# Patient Record
Sex: Female | Born: 1995 | Race: Black or African American | Hispanic: No | Marital: Single | Smoking: Former smoker
Health system: Southern US, Community
[De-identification: ages and names within clinical notes are randomized; demographics above are authoritative.]

## PROBLEM LIST (undated history)

## (undated) DIAGNOSIS — J45909 Unspecified asthma, uncomplicated: Secondary | ICD-10-CM

## (undated) DIAGNOSIS — D649 Anemia, unspecified: Secondary | ICD-10-CM

## (undated) HISTORY — PX: DILATION AND EVACUATION: SHX1459

---
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
## 2016-07-20 ENCOUNTER — Encounter: Payer: Self-pay | Admitting: Emergency Medicine

## 2016-07-20 ENCOUNTER — Emergency Department
Admission: EM | Admit: 2016-07-20 | Discharge: 2016-07-20 | Disposition: A | Discharge status: Home / Self Care | Payer: Self-pay | Attending: Emergency Medicine | Admitting: Emergency Medicine

## 2016-07-20 DIAGNOSIS — R51 Headache: Secondary | ICD-10-CM | POA: Insufficient documentation

## 2016-07-20 DIAGNOSIS — R519 Headache, unspecified: Secondary | ICD-10-CM

## 2016-07-20 LAB — POCT PREGNANCY, URINE: Preg Test, Ur: NEGATIVE

## 2016-07-20 MED ORDER — ONDANSETRON 4 MG PO TBDP
4.0000 mg | ORAL_TABLET | Freq: Once | ORAL | Status: AC | PRN
  Administered 2016-07-20: 4 mg via ORAL

## 2016-07-20 MED ORDER — ONDANSETRON 4 MG PO TBDP
ORAL_TABLET | ORAL | Status: DC
Start: 2016-07-20 — End: 2016-07-20
  Filled 2016-07-20: qty 1

## 2016-07-20 MED ORDER — KETOROLAC TROMETHAMINE 10 MG PO TABS
10.0000 mg | ORAL_TABLET | Freq: Once | ORAL | Status: AC
  Administered 2016-07-20: 10 mg via ORAL
  Filled 2016-07-20: qty 1

## 2016-07-20 NOTE — ED Triage Notes (Signed)
Pt ambulatory to triage in NAD, reports HA starting around 6 hours ago, described as throbbing, w/ n/v, denies sensitivity to light.  Denies hx of migraines.

## 2016-07-20 NOTE — ED Notes (Signed)
Brown, MD and Butch, RN at bedside at this time. 

## 2016-07-20 NOTE — ED Provider Notes (Signed)
[facility] Emergency Department Provider Note    First MD Initiated Contact with Patient 07/20/16 0413     (approximate)  I have reviewed the triage vital signs and the nursing notes.   HISTORY  Chief Complaint Headache    HPI Amy [name] is a 21 y.o. female presents to the emergency department with a headache that is generalized and began approximately 6 hours ago. Patient describes the pain as throbbing current pain score 8 out of 10. Patient states that she did not take any medication for headache before she presented to the emergency department. Patient denies any weakness numbness gait instability or visual changes   History reviewed. No pertinent past medical history.  Patient Active Problem List   Diagnosis Date Noted  . Depression, major, recurrent, moderate (HCC) 10/09/2015  . PTSD (post-traumatic stress disorder) 10/09/2015    History reviewed. No pertinent surgical history.  Prior to Admission medications   Not on File    Allergies No known drug allergies History reviewed. No pertinent family history.  Social History Social History  Substance Use Topics  . Smoking status: Never Smoker  . Smokeless tobacco: Never Used  . Alcohol use No    Review of Systems Constitutional: No fever/chills Eyes: No visual changes. ENT: No sore throat. Cardiovascular: Denies chest pain. Respiratory: Denies shortness of breath. Gastrointestinal: No abdominal pain.  No nausea, no vomiting.  No diarrhea.  No constipation. Genitourinary: Negative for dysuria. Musculoskeletal: Negative for back pain. Skin: Negative for rash. Neurological: Positive for headaches, negative for focal weakness or numbness.  10-point ROS otherwise negative.  ____________________________________________   PHYSICAL EXAM:  VITAL SIGNS: ED Triage Vitals  Enc Vitals Group     BP 07/20/16 0202 (!) 127/91     Pulse Rate 07/20/16 0202 91     Resp 07/20/16 0202  17     Temp 07/20/16 0202 98.7 F (37.1 C)     Temp Source 07/20/16 0202 Oral     SpO2 07/20/16 0202 97 %     Weight 07/20/16 0202 113 lb (51.3 kg)     Height 07/20/16 0202 5\' 1"  (1.549 m)     Head Circumference --      Peak Flow --      Pain Score 07/20/16 0206 10     Pain Loc --      Pain Edu? --      Excl. in GC? --     Constitutional: Alert and oriented. Well appearing and in no acute distress. Eyes: Conjunctivae are normal. PERRL. EOMI. Head: Atraumatic. Nose: No congestion/rhinnorhea. Mouth/Throat: Mucous membranes are moist.  Oropharynx non-erythematous. Neck: No stridor.  No meningeal signs.  Cardiovascular: Normal rate, regular rhythm. Good peripheral circulation. Grossly normal heart sounds. Respiratory: Normal respiratory effort.  No retractions. Lungs CTAB. Gastrointestinal: Soft and nontender. No distention.  Musculoskeletal: No lower extremity tenderness nor edema. No gross deformities of extremities. Neurologic:  Normal speech and language. No gross focal neurologic deficits are appreciated.  Skin:  Skin is warm, dry and intact. No rash noted. Psychiatric: Mood and affect are normal. Speech and behavior are normal.  ____________________________________________  Procedures   ______   INITIAL IMPRESSION / ASSESSMENT AND PLAN / ED COURSE  Pertinent labs & imaging results that were available during my care of the patient were reviewed by me and considered in my medical decision making (see chart for details).  21 year old female presenting with throbbing headache, no focal neurological deficits noted on exam. Patient  had a CT scan performed June of last year which was negative for masses or any intracranial pathology. Given negative exam we'll not perform any imaging at this time. Patient given Toradol 10 mg tablet with improvement of pain      ____________________________________________  FINAL CLINICAL IMPRESSION(S) / ED DIAGNOSES  Final diagnoses:    Acute nonintractable headache, unspecified headache type     MEDICATIONS GIVEN DURING THIS VISIT:  Medications  ketorolac (TORADOL) tablet 10 mg (not administered)  ondansetron (ZOFRAN-ODT) disintegrating tablet 4 mg (4 mg Oral Given 07/20/16 0209)     NEW OUTPATIENT MEDICATIONS STARTED DURING THIS VISIT:  New Prescriptions   No medications on file    Modified Medications   No medications on file    Discontinued Medications   No medications on file     Note:  This document was prepared using Dragon voice recognition software and may include unintentional dictation errors.    Darci Current, MD 07/20/16 [phone]

## 2016-08-17 LAB — HM HIV SCREENING LAB: HM HIV Screening: NEGATIVE

## 2016-09-27 ENCOUNTER — Encounter: Payer: Self-pay | Admitting: Emergency Medicine

## 2016-09-27 ENCOUNTER — Emergency Department
Admission: EM | Admit: 2016-09-27 | Discharge: 2016-09-27 | Disposition: A | Discharge status: Home / Self Care | Payer: Self-pay | Attending: Emergency Medicine | Admitting: Emergency Medicine

## 2016-09-27 DIAGNOSIS — Z87891 Personal history of nicotine dependence: Secondary | ICD-10-CM | POA: Insufficient documentation

## 2016-09-27 DIAGNOSIS — J45909 Unspecified asthma, uncomplicated: Secondary | ICD-10-CM | POA: Insufficient documentation

## 2016-09-27 DIAGNOSIS — K529 Noninfective gastroenteritis and colitis, unspecified: Secondary | ICD-10-CM | POA: Insufficient documentation

## 2016-09-27 HISTORY — DX: Anemia, unspecified: D64.9

## 2016-09-27 HISTORY — DX: Unspecified asthma, uncomplicated: J45.909

## 2016-09-27 LAB — CBC
HCT: 40.4 % (ref 35.0–47.0)
HEMOGLOBIN: 13.4 g/dL (ref 12.0–16.0)
MCH: 28.4 pg (ref 26.0–34.0)
MCHC: 33.2 g/dL (ref 32.0–36.0)
MCV: 85.4 fL (ref 80.0–100.0)
Platelets: 163 10*3/uL (ref 150–440)
RBC: 4.73 MIL/uL (ref 3.80–5.20)
RDW: 13.9 % (ref 11.5–14.5)
WBC: 4.7 10*3/uL (ref 3.6–11.0)

## 2016-09-27 LAB — COMPREHENSIVE METABOLIC PANEL
ALBUMIN: 3.9 g/dL (ref 3.5–5.0)
ALT: 11 U/L — ABNORMAL LOW (ref 14–54)
ANION GAP: 5 (ref 5–15)
AST: 28 U/L (ref 15–41)
Alkaline Phosphatase: 64 U/L (ref 38–126)
BUN: 14 mg/dL (ref 6–20)
CHLORIDE: 102 mmol/L (ref 101–111)
CO2: 29 mmol/L (ref 22–32)
Calcium: 9.1 mg/dL (ref 8.9–10.3)
Creatinine, Ser: 0.68 mg/dL (ref 0.44–1.00)
GFR calc non Af Amer: >60 mL/min (ref >60)
GLUCOSE: 107 mg/dL — AB (ref 65–99)
Potassium: 4.4 mmol/L (ref 3.5–5.1)
SODIUM: 136 mmol/L (ref 135–145)
Total Bilirubin: 0.4 mg/dL (ref 0.3–1.2)
Total Protein: 7.9 g/dL (ref 6.5–8.1)

## 2016-09-27 LAB — URINALYSIS, COMPLETE (UACMP) WITH MICROSCOPIC
BACTERIA UA: NONE SEEN
Bilirubin Urine: NEGATIVE
GLUCOSE, UA: NEGATIVE mg/dL
Hgb urine dipstick: NEGATIVE
KETONES UR: NEGATIVE mg/dL
Leukocytes, UA: NEGATIVE
Nitrite: NEGATIVE
PROTEIN: NEGATIVE mg/dL
Specific Gravity, Urine: 1.012 (ref 1.005–1.030)
pH: 7 (ref 5.0–8.0)

## 2016-09-27 LAB — LIPASE, BLOOD: LIPASE: 22 U/L (ref 11–51)

## 2016-09-27 LAB — POCT PREGNANCY, URINE: PREG TEST UR: NEGATIVE

## 2016-09-27 MED ORDER — ONDANSETRON HCL 4 MG PO TABS: 4.0000 mg | ORAL_TABLET | Freq: Three times a day (TID) | ORAL | 0 refills | Status: AC | PRN

## 2016-09-27 MED ORDER — METOCLOPRAMIDE HCL 5 MG/ML IJ SOLN
10.0000 mg | Freq: Once | INTRAMUSCULAR | Status: AC
  Administered 2016-09-27: 10 mg via INTRAVENOUS
  Filled 2016-09-27: qty 2

## 2016-09-27 MED ORDER — SODIUM CHLORIDE 0.9 % IV BOLUS (SEPSIS)
1000.0000 mL | Freq: Once | INTRAVENOUS | Status: AC
  Administered 2016-09-27: 1000 mL via INTRAVENOUS

## 2016-09-27 NOTE — ED Triage Notes (Signed)
Pt c/o n/v/d intermittently since Tuesday. x1 emesis last 24 hours. C/o mid abd pain, nontender to palpation. Denies urinary symptoms.

## 2016-09-27 NOTE — ED Notes (Signed)
Pt reports that she has had nausea for 2 days and started vomiting today (vomited x1 in 24 hours) - c/o abd pain - reports diarrhea Tuesday (3 loose stools in 24 hours)

## 2016-09-27 NOTE — ED Provider Notes (Signed)
[facility] Emergency Department Provider Note  ____________________________________________   I have reviewed the triage vital signs and the nursing notes.   HISTORY  Chief Complaint Emesis   History limited by: Not Limited   HPI Amy [name] is a 21 y.o. female who presents to the emergency department today because of concerns for nausea and vomiting. This is been going on for the past 2 mornings. The patient states she also has had some diarrhea. No significant associated abdominal pain. She has not noticed any bloody vomiting. She has not had any fevers. Denies any known sick contacts or unusual ingestions.   Past Medical History:  Diagnosis Date  . Anemia   . Asthma     Patient Active Problem List   Diagnosis Date Noted  . Depression, major, recurrent, moderate (HCC) 10/09/2015  . PTSD (post-traumatic stress disorder) 10/09/2015    History reviewed. No pertinent surgical history.  Prior to Admission medications   Not on File    Allergies Patient has no known allergies.  History reviewed. No pertinent family history.  Social History Social History  Substance Use Topics  . Smoking status: Former Smoker    Types: Cigarettes  . Smokeless tobacco: Never Used  . Alcohol use No     Comment: once/week    Review of Systems Constitutional: No fever/chills Eyes: No visual changes. ENT: No sore throat. Cardiovascular: Denies chest pain. Respiratory: Denies shortness of breath. Gastrointestinal: No abdominal pain.  Positive for nausea, vomiting, diarrhea.  Genitourinary: Negative for dysuria. Musculoskeletal: Negative for back pain. Skin: Negative for rash. Neurological: Negative for headaches, focal weakness or numbness.  ____________________________________________   PHYSICAL EXAM:  VITAL SIGNS: ED Triage Vitals  Enc Vitals Group     BP 09/27/16 1529 124/74     Pulse Rate 09/27/16 1529 90     Resp 09/27/16 1529 18      Temp 09/27/16 1529 99.1 F (37.3 C)     Temp Source 09/27/16 1529 Oral     SpO2 09/27/16 1529 98 %     Weight 09/27/16 1531 110 lb (49.9 kg)     Height 09/27/16 1531 5' 1.5" (1.562 m)     Head Circumference --      Peak Flow --      Pain Score 09/27/16 1537 7   Constitutional: Alert and oriented. Well appearing and in no distress. Eyes: Conjunctivae are normal.  ENT   Head: Normocephalic and atraumatic.   Nose: No congestion/rhinnorhea.   Mouth/Throat: Mucous membranes are moist.   Neck: No stridor. Hematological/Lymphatic/Immunilogical: No cervical lymphadenopathy. Cardiovascular: Normal rate, regular rhythm.  No murmurs, rubs, or gallops.  Respiratory: Normal respiratory effort without tachypnea nor retractions. Breath sounds are clear and equal bilaterally. No wheezes/rales/rhonchi. Gastrointestinal: Soft and non tender. No rebound. No guarding.  Genitourinary: Deferred Musculoskeletal: Normal range of motion in all extremities. No lower extremity edema. Neurologic:  Normal speech and language. No gross focal neurologic deficits are appreciated.  Skin:  Skin is warm, dry and intact. No rash noted. Psychiatric: Mood and affect are normal. Speech and behavior are normal. Patient exhibits appropriate insight and judgment.  ____________________________________________    LABS (pertinent positives/negatives)  Labs Reviewed  COMPREHENSIVE METABOLIC PANEL - Abnormal; Notable for the following:       Result Value   Glucose, Bld 107 (*)    ALT 11 (*)    All other components within normal limits  URINALYSIS, COMPLETE (UACMP) WITH MICROSCOPIC - Abnormal; Notable for the following:  Color, Urine YELLOW (*)    APPearance CLEAR (*)    Squamous Epithelial / LPF 0-5 (*)    All other components within normal limits  LIPASE, BLOOD  CBC  POC URINE PREG, ED  POCT PREGNANCY, URINE      ____________________________________________   EKG  None  ____________________________________________    RADIOLOGY  None  ____________________________________________   PROCEDURES  Procedures  ____________________________________________   INITIAL IMPRESSION / ASSESSMENT AND PLAN / ED COURSE  Pertinent labs & imaging results that were available during my care of the patient were reviewed by me and considered in my medical decision making (see chart for details).  Patient presented with nausea vomiting diarrhea. Blood work and urine without any overly concerning findings. Patient did feel better after IV fluids and nausea medication. At this point I think gastroenteritis likely. I doubt significant intra-abdominal infection and a do not think emergent imaging is necessary at this time. Will discharge with antiemetics.  ____________________________________________   FINAL CLINICAL IMPRESSION(S) / ED DIAGNOSES  Final diagnoses:  Gastroenteritis     Note: This dictation was prepared with Dragon dictation. Any transcriptional errors that result from this process are unintentional     Phineas Semen, MD 09/27/16 [phone]

## 2016-09-27 NOTE — Discharge Instructions (Signed)
Please seek medical attention for any high fevers, chest pain, shortness of breath, change in behavior, persistent vomiting, bloody stool or any other new or concerning symptoms.  

## 2016-10-03 ENCOUNTER — Emergency Department
Admission: EM | Admit: 2016-10-03 | Discharge: 2016-10-03 | Disposition: A | Discharge status: Home / Self Care | Payer: Self-pay | Attending: Emergency Medicine | Admitting: Emergency Medicine

## 2016-10-03 ENCOUNTER — Emergency Department: Payer: Self-pay

## 2016-10-03 DIAGNOSIS — J45909 Unspecified asthma, uncomplicated: Secondary | ICD-10-CM | POA: Insufficient documentation

## 2016-10-03 DIAGNOSIS — Y939 Activity, unspecified: Secondary | ICD-10-CM | POA: Insufficient documentation

## 2016-10-03 DIAGNOSIS — W010XXA Fall on same level from slipping, tripping and stumbling without subsequent striking against object, initial encounter: Secondary | ICD-10-CM | POA: Insufficient documentation

## 2016-10-03 DIAGNOSIS — Z87891 Personal history of nicotine dependence: Secondary | ICD-10-CM | POA: Insufficient documentation

## 2016-10-03 DIAGNOSIS — Y929 Unspecified place or not applicable: Secondary | ICD-10-CM | POA: Insufficient documentation

## 2016-10-03 DIAGNOSIS — Y998 Other external cause status: Secondary | ICD-10-CM | POA: Insufficient documentation

## 2016-10-03 DIAGNOSIS — S322XXA Fracture of coccyx, initial encounter for closed fracture: Secondary | ICD-10-CM | POA: Insufficient documentation

## 2016-10-03 MED ORDER — IBUPROFEN 600 MG PO TABS
600.0000 mg | ORAL_TABLET | Freq: Once | ORAL | Status: AC
  Administered 2016-10-03: 600 mg via ORAL
  Filled 2016-10-03: qty 1

## 2016-10-03 MED ORDER — OXYCODONE-ACETAMINOPHEN 5-325 MG PO TABS
1.0000 | ORAL_TABLET | Freq: Once | ORAL | Status: AC
  Administered 2016-10-03: 1 via ORAL

## 2016-10-03 MED ORDER — OXYCODONE-ACETAMINOPHEN 5-325 MG PO TABS
1.0000 | ORAL_TABLET | Freq: Once | ORAL | Status: AC
  Administered 2016-10-03: 1 via ORAL
  Filled 2016-10-03: qty 1

## 2016-10-03 MED ORDER — OXYCODONE-ACETAMINOPHEN 5-325 MG PO TABS
ORAL_TABLET | ORAL | Status: AC
  Administered 2016-10-03: 1 via ORAL
  Filled 2016-10-03: qty 1

## 2016-10-03 MED ORDER — IBUPROFEN 600 MG PO TABS: 600.0000 mg | ORAL_TABLET | Freq: Three times a day (TID) | ORAL | 0 refills | Status: AC | PRN

## 2016-10-03 MED ORDER — OXYCODONE-ACETAMINOPHEN 5-325 MG PO TABS: 1.0000 | ORAL_TABLET | ORAL | 0 refills | Status: AC | PRN
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
## 2016-10-03 NOTE — ED Notes (Signed)
Patient discharged to home per MD order. Patient in stable condition, and deemed medically cleared by ED provider for discharge. Discharge instructions reviewed with patient/family using "Teach Back"; verbalized understanding of medication education and administration, and information about follow-up care. Denies further concerns. ° °
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
## 2017-01-29 DIAGNOSIS — F419 Anxiety disorder, unspecified: Secondary | ICD-10-CM | POA: Insufficient documentation

## 2017-09-26 ENCOUNTER — Encounter: Payer: Self-pay | Admitting: *Deleted

## 2017-09-26 ENCOUNTER — Emergency Department
Admission: EM | Admit: 2017-09-26 | Discharge: 2017-09-26 | Disposition: A | Discharge status: Home / Self Care | Payer: Medicaid Other | Attending: Student in an Organized Health Care Education/Training Program | Admitting: Student in an Organized Health Care Education/Training Program

## 2017-09-26 ENCOUNTER — Other Ambulatory Visit: Payer: Self-pay

## 2017-09-26 DIAGNOSIS — M7918 Myalgia, other site: Secondary | ICD-10-CM

## 2017-09-26 DIAGNOSIS — M7912 Myalgia of auxiliary muscles, head and neck: Secondary | ICD-10-CM | POA: Insufficient documentation

## 2017-09-26 MED ORDER — CYCLOBENZAPRINE HCL 5 MG PO TABS
5.0000 mg | ORAL_TABLET | Freq: Three times a day (TID) | ORAL | 0 refills | Status: AC | PRN
Start: 2017-09-26 — End: ?
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
## 2018-03-07 ENCOUNTER — Emergency Department
Admission: EM | Admit: 2018-03-07 | Discharge: 2018-03-07 | Disposition: A | Discharge status: Home / Self Care | Payer: BLUE CROSS/BLUE SHIELD | Attending: Emergency Medicine | Admitting: Emergency Medicine

## 2018-03-07 ENCOUNTER — Encounter: Payer: Self-pay | Admitting: *Deleted

## 2018-03-07 ENCOUNTER — Emergency Department: Payer: BLUE CROSS/BLUE SHIELD

## 2018-03-07 ENCOUNTER — Other Ambulatory Visit: Payer: Self-pay

## 2018-03-07 DIAGNOSIS — Z5321 Procedure and treatment not carried out due to patient leaving prior to being seen by health care provider: Secondary | ICD-10-CM | POA: Insufficient documentation

## 2018-03-07 DIAGNOSIS — M79601 Pain in right arm: Secondary | ICD-10-CM | POA: Insufficient documentation
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
## 2019-04-15 DIAGNOSIS — L680 Hirsutism: Secondary | ICD-10-CM

## 2019-04-16 ENCOUNTER — Ambulatory Visit: Payer: Self-pay

## 2019-04-21 ENCOUNTER — Ambulatory Visit: Payer: Medicaid Other
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
## 2019-06-03 ENCOUNTER — Ambulatory Visit: Payer: Self-pay | Attending: Internal Medicine

## 2019-06-03 DIAGNOSIS — Z20822 Contact with and (suspected) exposure to covid-19: Secondary | ICD-10-CM

## 2019-06-04 LAB — NOVEL CORONAVIRUS, NAA: SARS-CoV-2, NAA: NOT DETECTED

## 2019-08-29 ENCOUNTER — Encounter: Payer: Self-pay | Admitting: Emergency Medicine

## 2019-08-29 ENCOUNTER — Other Ambulatory Visit: Payer: Self-pay

## 2019-08-29 ENCOUNTER — Ambulatory Visit
Admission: EM | Admit: 2019-08-29 | Discharge: 2019-08-29 | Disposition: A | Discharge status: Home / Self Care | Payer: Managed Care, Other (non HMO) | Attending: Family Medicine | Admitting: Family Medicine

## 2019-08-29 DIAGNOSIS — R05 Cough: Secondary | ICD-10-CM | POA: Diagnosis not present

## 2019-08-29 DIAGNOSIS — R1110 Vomiting, unspecified: Secondary | ICD-10-CM

## 2019-08-29 DIAGNOSIS — R112 Nausea with vomiting, unspecified: Secondary | ICD-10-CM | POA: Diagnosis not present

## 2019-08-29 DIAGNOSIS — R0981 Nasal congestion: Secondary | ICD-10-CM | POA: Diagnosis not present

## 2019-08-29 DIAGNOSIS — B349 Viral infection, unspecified: Secondary | ICD-10-CM | POA: Diagnosis present

## 2019-08-29 DIAGNOSIS — Z87891 Personal history of nicotine dependence: Secondary | ICD-10-CM | POA: Diagnosis not present

## 2019-08-29 DIAGNOSIS — Z79899 Other long term (current) drug therapy: Secondary | ICD-10-CM | POA: Insufficient documentation

## 2019-08-29 DIAGNOSIS — Z20822 Contact with and (suspected) exposure to covid-19: Secondary | ICD-10-CM | POA: Insufficient documentation

## 2019-08-29 DIAGNOSIS — J45909 Unspecified asthma, uncomplicated: Secondary | ICD-10-CM | POA: Insufficient documentation

## 2019-08-29 LAB — SARS CORONAVIRUS 2 (TAT 6-24 HRS): SARS Coronavirus 2: NEGATIVE

## 2019-08-29 MED ORDER — ONDANSETRON 8 MG PO TBDP: 8.0000 mg | ORAL_TABLET | Freq: Three times a day (TID) | ORAL | 0 refills | Status: AC | PRN

## 2019-08-29 NOTE — Discharge Instructions (Signed)
Rest, fluids, over the counter medications as needed °Await covid test °

## 2019-08-29 NOTE — ED Provider Notes (Signed)
[facility]    CSN: [id number] Arrival date & time: 08/29/19  1103      History   Chief Complaint Chief Complaint  Patient presents with  . Emesis  . Generalized Body Aches  . Cough  . Nasal Congestion    HPI Amy [name] is a 24 y.o. female.   24 yo female with a c/o cough, nasal congestion, runny nose, body aches for the past 2-3 days and vomiting last night. Denies any fevers, chest pains, shortness of breath, diarrhea.    Emesis Associated symptoms: cough   Cough   Past Medical History:  Diagnosis Date  . Anemia   . Asthma     Patient Active Problem List   Diagnosis Date Noted  . Anxiety 01/29/2017  . Hirsutism 11/22/2015  . Depression, major, recurrent, moderate (HCC) 10/09/2015  . PTSD (post-traumatic stress disorder) 10/09/2015    Past Surgical History:  Procedure Laterality Date  . DILATION AND EVACUATION     after pre term labor at [facility] 3 1/2 mo    OB History   No obstetric history on file.      Home Medications    Prior to Admission medications   Medication Sig Start Date End Date Taking? Authorizing Provider  cyclobenzaprine (FLEXERIL) 5 MG tablet Take 1 tablet (5 mg total) by mouth 3 (three) times daily as needed for muscle spasms. 09/26/17   Menshew, Charlesetta Ivory, PA-C  ibuprofen (ADVIL,MOTRIN) 600 MG tablet Take 1 tablet (600 mg total) by mouth every 8 (eight) hours as needed. 10/03/16   Darci Current, MD  ondansetron (ZOFRAN ODT) 8 MG disintegrating tablet Take 1 tablet (8 mg total) by mouth every 8 (eight) hours as needed. 08/29/19   Payton Mccallum, MD  ondansetron (ZOFRAN) 4 MG tablet Take 1 tablet (4 mg total) by mouth every 8 (eight) hours as needed for nausea or vomiting. 09/27/16   Phineas Semen, MD  oxyCODONE-acetaminophen (ROXICET) 5-325 MG tablet Take 1 tablet by mouth every 4 (four) hours as needed for severe pain. 10/03/16   Darci Current, MD    Family History Family History  Problem Relation Age of  Onset  . Anemia Mother   . Diabetes Mother   . Anemia Sister   . Diabetes Paternal Grandfather     Social History Social History   Tobacco Use  . Smoking status: Former Smoker    Types: Cigarettes  . Smokeless tobacco: Never Used  Substance Use Topics  . Alcohol use: No    Comment: once/week  . Drug use: No     Allergies   Patient has no known allergies.   Review of Systems Review of Systems  Respiratory: Positive for cough.   Gastrointestinal: Positive for vomiting.     Physical Exam Triage Vital Signs ED Triage Vitals  Enc Vitals Group     BP 08/29/19 1119 122/82     Pulse Rate 08/29/19 1119 82     Resp 08/29/19 1119 14     Temp 08/29/19 1119 98.2 F (36.8 C)     Temp Source 08/29/19 1119 Oral     SpO2 08/29/19 1119 100 %     Weight 08/29/19 1115 125 lb (56.7 kg)     Height 08/29/19 1115 5\' 1"  (1.549 m)     Head Circumference --      Peak Flow --      Pain Score 08/29/19 1115 8     Pain Loc --  Pain Edu? --      Excl. in [place]? --    No data found.  Updated Vital Signs BP 122/82 (BP Location: Right Arm)   Pulse 82   Temp 98.2 F (36.8 C) (Oral)   Resp 14   Ht 5\' 1"  (1.549 m)   Wt 56.7 kg   LMP 07/31/2019 (Exact Date)   SpO2 100%   BMI 23.62 kg/m   Visual Acuity Right Eye Distance:   Left Eye Distance:   Bilateral Distance:    Right Eye Near:   Left Eye Near:    Bilateral Near:     Physical Exam Vitals and nursing note reviewed.  Constitutional:      General: She is not in acute distress.    Appearance: Normal appearance. She is not toxic-appearing or diaphoretic.  HENT:     Right Ear: Tympanic membrane normal.     Left Ear: Tympanic membrane normal.     Nose: Congestion present.     Mouth/Throat:     Pharynx: Posterior oropharyngeal erythema present. No oropharyngeal exudate.  Cardiovascular:     Rate and Rhythm: Normal rate.     Heart sounds: Normal heart sounds.  Pulmonary:     Effort: Pulmonary effort is normal. No  respiratory distress.     Breath sounds: Normal breath sounds. No stridor. No wheezing, rhonchi or rales.  Abdominal:     General: Bowel sounds are normal. There is no distension.     Palpations: Abdomen is soft.  Musculoskeletal:     Cervical back: Neck supple. No rigidity.  Neurological:     Mental Status: She is alert.      UC Treatments / Results  Labs (all labs ordered are listed, but only abnormal results are displayed) Labs Reviewed  SARS CORONAVIRUS 2 (TAT 6-24 HRS)    EKG   Radiology No results found.  Procedures Procedures (including critical care time)  Medications Ordered in UC Medications - No data to display  Initial Impression / Assessment and Plan / UC Course  I have reviewed the triage vital signs and the nursing notes.  Pertinent labs & imaging results that were available during my care of the patient were reviewed by me and considered in my medical decision making (see chart for details).      Final Clinical Impressions(s) / UC Diagnoses   Final diagnoses:  Acute viral syndrome  Non-intractable vomiting, presence of nausea not specified, unspecified vomiting type     Discharge Instructions     Rest, fluids, over the counter medications as needed Await covid test    ED Prescriptions    Medication Sig Dispense Auth. Provider   ondansetron (ZOFRAN ODT) 8 MG disintegrating tablet Take 1 tablet (8 mg total) by mouth every 8 (eight) hours as needed. 6 tablet Norval Gable, MD      1. diagnosis reviewed with patient 2. rx as per orders above; reviewed possible side effects, interactions, risks and benefits  3. Recommend supportive treatment with rest, fluids, otc meds prn 4. Follow-up prn if symptoms worsen or don't improve   PDMP not reviewed this encounter.   Norval Gable, MD 08/29/19 1233

## 2019-08-29 NOTE — ED Triage Notes (Signed)
Patient c/o bodyaches, vomiting, cough, congestion and runny nose that started yesterday.  Patient denies fevers.

## 2023-05-16 ENCOUNTER — Other Ambulatory Visit: Payer: Self-pay

## 2023-05-16 DIAGNOSIS — R519 Headache, unspecified: Secondary | ICD-10-CM | POA: Diagnosis present

## 2023-05-16 DIAGNOSIS — J45909 Unspecified asthma, uncomplicated: Secondary | ICD-10-CM | POA: Insufficient documentation

## 2023-05-16 LAB — CBC
HCT: 36.2 % (ref 36.0–46.0)
Hemoglobin: 11.7 g/dL — ABNORMAL LOW (ref 12.0–15.0)
MCH: 26.8 pg (ref 26.0–34.0)
MCHC: 32.3 g/dL (ref 30.0–36.0)
MCV: 83 fL (ref 80.0–100.0)
Platelets: 338 10*3/uL (ref 150–400)
RBC: 4.36 MIL/uL (ref 3.87–5.11)
RDW: 13.8 % (ref 11.5–15.5)
WBC: 5.4 10*3/uL (ref 4.0–10.5)
nRBC: 0 % (ref 0.0–0.2)

## 2023-05-16 LAB — BASIC METABOLIC PANEL
Anion gap: 10 (ref 5–15)
BUN: 13 mg/dL (ref 6–20)
CO2: 24 mmol/L (ref 22–32)
Calcium: 9.3 mg/dL (ref 8.9–10.3)
Chloride: 102 mmol/L (ref 98–111)
Creatinine, Ser: 0.81 mg/dL (ref 0.44–1.00)
GFR, Estimated: >60 mL/min (ref >60)
Glucose, Bld: 92 mg/dL (ref 70–99)
Potassium: 3.7 mmol/L (ref 3.5–5.1)
Sodium: 136 mmol/L (ref 135–145)

## 2023-05-16 NOTE — ED Triage Notes (Signed)
 Pt to ED via [facility] from home c/o headache that started around 7am today. Pt has hx of chronic headaches but says this one is worse. Endorses photosensitivity

## 2023-05-17 ENCOUNTER — Emergency Department
Admission: EM | Admit: 2023-05-17 | Discharge: 2023-05-17 | Disposition: A | Discharge status: Home / Self Care | Payer: Medicaid Other | Attending: Emergency Medicine | Admitting: Emergency Medicine

## 2023-05-17 DIAGNOSIS — R519 Headache, unspecified: Secondary | ICD-10-CM

## 2023-05-17 LAB — HCG, QUANTITATIVE, PREGNANCY: hCG, Beta Chain, Quant, S: 1 m[IU]/mL (ref <5)

## 2023-05-17 MED ORDER — DEXAMETHASONE SODIUM PHOSPHATE 10 MG/ML IJ SOLN
10.0000 mg | Freq: Once | INTRAMUSCULAR | Status: AC
Start: 2023-05-17 — End: 2023-05-17
  Administered 2023-05-17: 10 mg via INTRAVENOUS
  Filled 2023-05-17: qty 1

## 2023-05-17 MED ORDER — METOCLOPRAMIDE HCL 5 MG/ML IJ SOLN
5.0000 mg | Freq: Once | INTRAMUSCULAR | Status: AC
  Administered 2023-05-17: 5 mg via INTRAVENOUS
  Filled 2023-05-17: qty 2

## 2023-05-17 MED ORDER — SODIUM CHLORIDE 0.9 % IV BOLUS
500.0000 mL | Freq: Once | INTRAVENOUS | Status: AC
  Administered 2023-05-17: 500 mL via INTRAVENOUS

## 2023-05-17 MED ORDER — KETOROLAC TROMETHAMINE 30 MG/ML IJ SOLN
15.0000 mg | Freq: Once | INTRAMUSCULAR | Status: AC
  Administered 2023-05-17: 15 mg via INTRAVENOUS
  Filled 2023-05-17: qty 1

## 2023-05-17 MED ORDER — METOCLOPRAMIDE HCL 10 MG PO TABS
10.0000 mg | ORAL_TABLET | Freq: Three times a day (TID) | ORAL | 0 refills | Status: AC | PRN
Start: 2023-05-17 — End: ?

## 2023-05-17 NOTE — Discharge Instructions (Signed)
 You may take Reglan up to 4 times daily as needed for headache/nausea.  Return to the ER for worsening symptoms, persistent vomiting, lethargy or other concerns.

## 2023-05-17 NOTE — ED Provider Notes (Signed)
 [facility] Provider Note    Event Date/Time   First MD Initiated Contact with Patient 05/17/23 0102     (approximate)   History   Headache   HPI  Amy [name] is a 28 y.o. female brought to the ED via EMS from home with a chief complaint of global headache which started around 7 AM yesterday.  Patient reports history of chronic headaches x 1 year attributed to pregnancy-induced hypertension 1 year ago.  She has not currently taking antihypertensives.  Reports global headache every other day.  Endorses associated photosensitivity and nausea.  Denies fever/chills, vision changes, neck pain, chest pain, shortness of breath, abdominal pain, vomiting or dizziness.     Past Medical History   Past Medical History:  Diagnosis Date   Anemia    Asthma      Active Problem List   Patient Active Problem List   Diagnosis Date Noted   Anxiety 01/29/2017   Hirsutism 11/22/2015   Depression, major, recurrent, moderate (HCC) 10/09/2015   PTSD (post-traumatic stress disorder) 10/09/2015     Past Surgical History   Past Surgical History:  Procedure Laterality Date   DILATION AND EVACUATION     after pre term labor at [facility] 3 1/2 mo     Home Medications   Prior to Admission medications   Medication Sig Start Date End Date Taking? Authorizing Provider  metoCLOPramide  (REGLAN ) 10 MG tablet Take 1 tablet (10 mg total) by mouth every 8 (eight) hours as needed for nausea. 05/17/23  Yes Robinette Vermell PARAS, MD  cyclobenzaprine  (FLEXERIL ) 5 MG tablet Take 1 tablet (5 mg total) by mouth 3 (three) times daily as needed for muscle spasms. 09/26/17   Menshew, Candida LULLA Kings, PA-C  ibuprofen  (ADVIL ,MOTRIN ) 600 MG tablet Take 1 tablet (600 mg total) by mouth every 8 (eight) hours as needed. 10/03/16   Delores Raford SAILOR, MD  ondansetron  (ZOFRAN  ODT) 8 MG disintegrating tablet Take 1 tablet (8 mg total) by mouth every 8 (eight) hours as needed. 08/29/19   Servando Hire, MD   ondansetron  (ZOFRAN ) 4 MG tablet Take 1 tablet (4 mg total) by mouth every 8 (eight) hours as needed for nausea or vomiting. 09/27/16   Floy Roberts, MD  oxyCODONE -acetaminophen  (ROXICET) 5-325 MG tablet Take 1 tablet by mouth every 4 (four) hours as needed for severe pain. 10/03/16   Delores Raford SAILOR, MD     Allergies  Patient has no known allergies.   Family History   Family History  Problem Relation Age of Onset   Anemia Mother    Diabetes Mother    Anemia Sister    Diabetes Paternal Grandfather      Physical Exam  Triage Vital Signs: ED Triage Vitals  Encounter Vitals Group     BP 05/16/23 2258 (!) 146/96     Systolic BP Percentile --      Diastolic BP Percentile --      Pulse Rate 05/16/23 2258 (!) 104     Resp 05/16/23 2258 16     Temp 05/16/23 2258 98.9 F (37.2 C)     Temp Source 05/16/23 2258 Oral     SpO2 05/16/23 2258 98 %     Weight --      Height --      Head Circumference --      Peak Flow --      Pain Score 05/16/23 2257 10     Pain Loc --  Pain Education --      Exclude from Growth Chart --     Updated Vital Signs: BP 118/73 (BP Location: Right Arm)   Pulse 77   Temp 98.7 F (37.1 C) (Oral)   Resp 18   LMP 05/16/2023   SpO2 100%    General: Awake, mild distress.  CV:  RRR.  Good peripheral perfusion.  Resp:  Normal effort.  CTAB. Abd:  Nontender.  No distention.  Other:  Photophobic.  PERRL.  EOMI.  No carotid bruits.  Supple neck without meningismus.  Alert and oriented x 3.  CN II-XII grossly intact.  5/5 motor strength and sensation all extremities.  M AE x 4.  Ambulatory with steady gait.  No petechiae.   ED Results / Procedures / Treatments  Labs (all labs ordered are listed, but only abnormal results are displayed) Labs Reviewed  CBC - Abnormal; Notable for the following components:      Result Value   Hemoglobin 11.7 (*)    All other components within normal limits  BASIC METABOLIC PANEL  HCG, QUANTITATIVE, PREGNANCY      EKG  None   RADIOLOGY None   Official radiology report(s): No results found.   PROCEDURES:  Critical Care performed: No  Procedures   MEDICATIONS ORDERED IN ED: Medications  sodium chloride  0.9 % bolus 500 mL (0 mLs Intravenous Stopped 05/17/23 0310)  ketorolac  (TORADOL ) 30 MG/ML injection 15 mg (15 mg Intravenous Given 05/17/23 0147)  metoCLOPramide  (REGLAN ) injection 5 mg (5 mg Intravenous Given 05/17/23 0145)  dexamethasone  (DECADRON ) injection 10 mg (10 mg Intravenous Given 05/17/23 0148)     IMPRESSION / MDM / ASSESSMENT AND PLAN / ED COURSE  I reviewed the triage vital signs and the nursing notes.                             28 year old female with chronic headaches presenting with acute headache with nausea and photosensitivity. Differential diagnosis includes, but is not limited to, intracranial hemorrhage, meningitis/encephalitis, previous head trauma, cavernous venous thrombosis, tension headache, temporal arteritis, migraine or migraine equivalent, idiopathic intracranial hypertension, and non-specific headache.  I personally reviewed patient's records and note her telephone communication with her PCPs nurse triage line regarding headache and hypertension.  Patient's presentation is most consistent with acute complicated illness / injury requiring diagnostic workup.  Laboratory results unremarkable.  Will administer IV fluids, Decadron , ketorolac  and Reglan .  Will reassess.  Clinical Course as of 05/17/23 0455  Fri May 17, 2023  0256 Patient feeling significantly better.  Neck remains supple; patient remains without focal neurological deficits.  Will discharge home with as needed prescription for Reglan  and patient will follow-up with her PCP closely.  Strict return precautions given.  Patient verbalizes understanding and agrees with plan of care. [JS]    Clinical Course User Index [JS] Robinette Vermell PARAS, MD     FINAL CLINICAL IMPRESSION(S) / ED DIAGNOSES    Final diagnoses:  Acute nonintractable headache, unspecified headache type     Rx / DC Orders   ED Discharge Orders          Ordered    metoCLOPramide  (REGLAN ) 10 MG tablet  Every 8 hours PRN        05/17/23 0257             Note:  This document was prepared using Dragon voice recognition software and may include unintentional dictation errors.   Shigeko Manard  J, MD 05/17/23 [phone]
